# Patient Record
Sex: Male | Born: 1980 | Race: White | Hispanic: No | Marital: Single | Smoking: Never smoker
Health system: Southern US, Community
[De-identification: ages and names within clinical notes are randomized; demographics above are authoritative.]

## PROBLEM LIST (undated history)

## (undated) DIAGNOSIS — I10 Essential (primary) hypertension: Secondary | ICD-10-CM

---

## 2006-11-14 ENCOUNTER — Emergency Department (HOSPITAL_COMMUNITY): Admission: EM | Admit: 2006-11-14 | Discharge: 2006-11-14 | Payer: Self-pay | Admitting: Interventional Radiology

## 2008-09-03 IMAGING — CT CT ABDOMEN W/ CM
2 of 5 series · 17 of 46 positions shown, 19 images · IV contrast (APPLIED)
Comparison: None.

CLINICAL DATA: Abdominal pain with vomiting.  Possible dehydration.  
 ABDOMEN CT WITH CONTRAST:
TECHNIQUE: Multidetector CT imaging of the abdomen was performed following the standard protocol during bolus administration of intravenous contrast.
 Contrast:  100 cc Omnipaque 300.  Oral contrast was given.
TECHNIQUE: Multidetector CT imaging of the pelvis was performed following the standard protocol during bolus administration of intravenous contrast.

[Series 2: abd/pelv with 5.0 b31f st · axial · 0.68mm/px · z∈[-491,-66]mm · 14 of 96 slices shown, 16 images]
[im 6/96  soft-tissue]
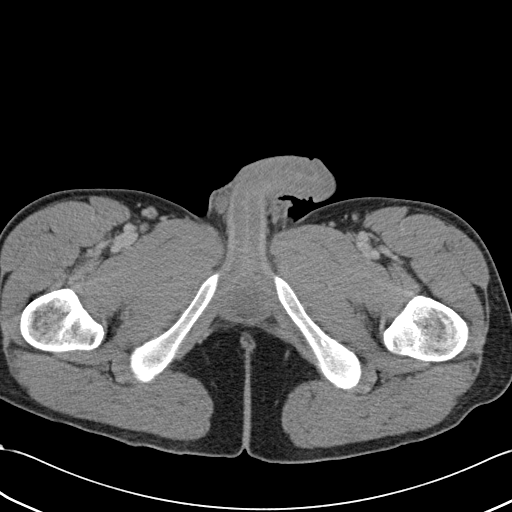
[im 6/96  bone]
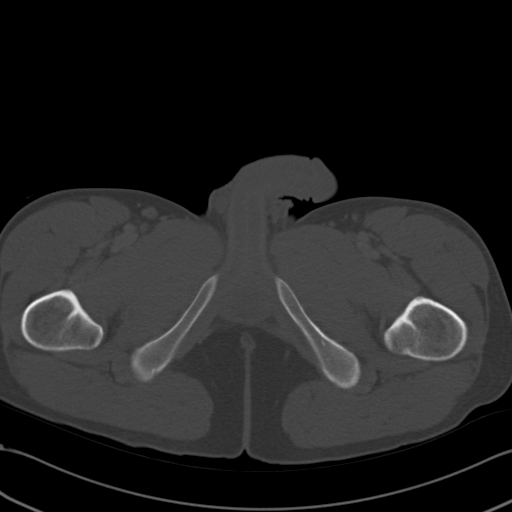
[im 11/96  soft-tissue]
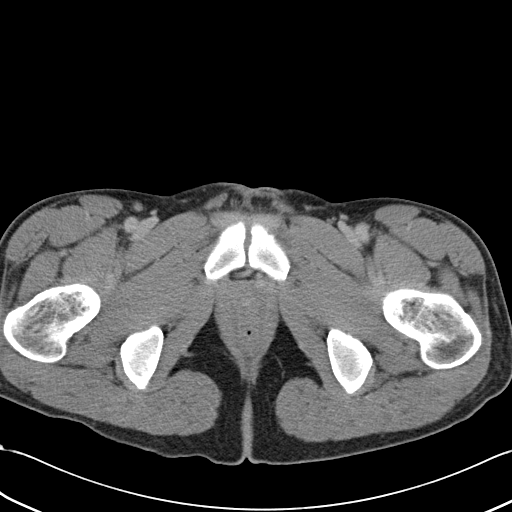
[im 21/96  soft-tissue]
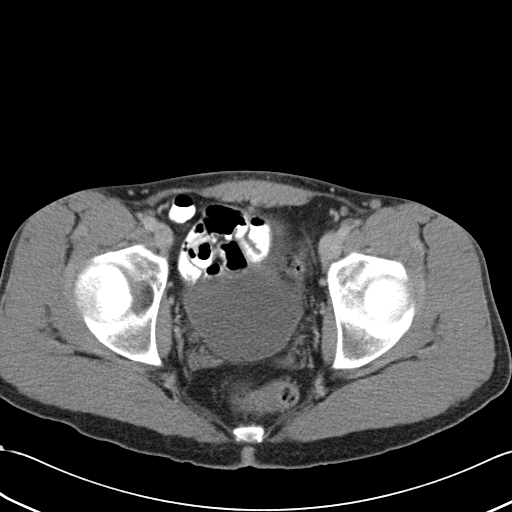
[im 26/96  soft-tissue]
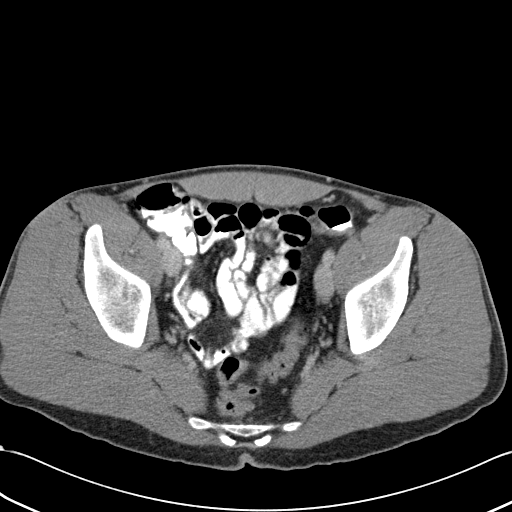
[im 31/96  soft-tissue]
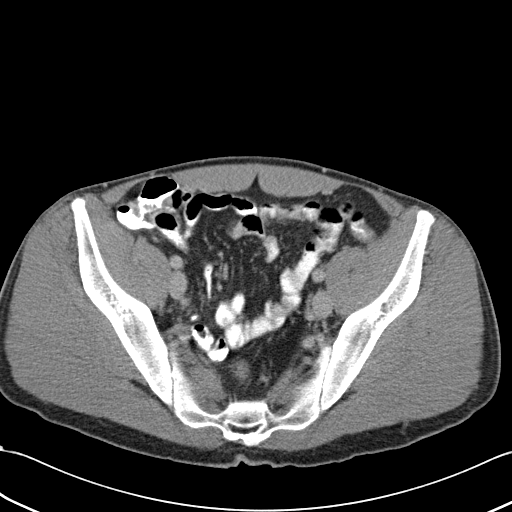
[im 41/96  soft-tissue]
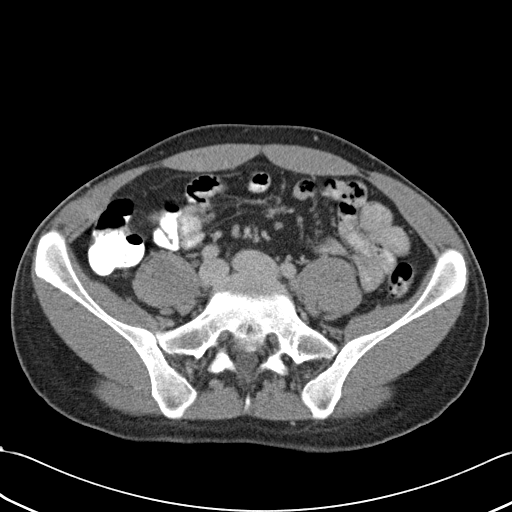
[im 46/96  soft-tissue]
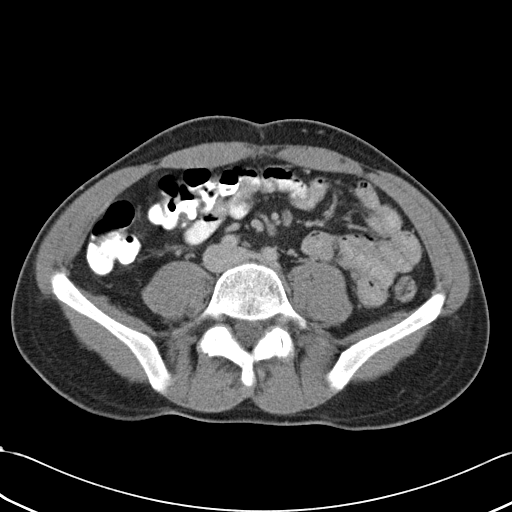
[im 51/96  soft-tissue]
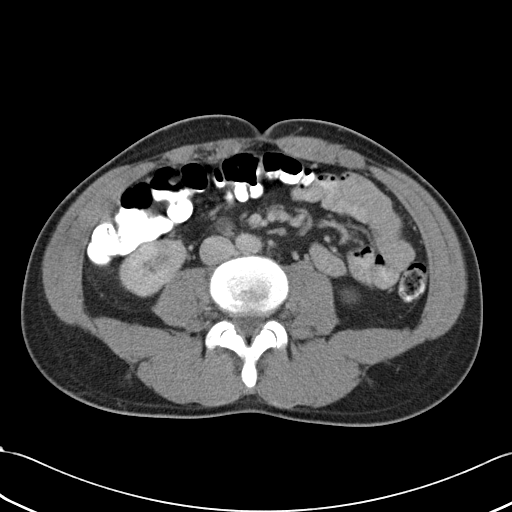
[im 56/96  soft-tissue]
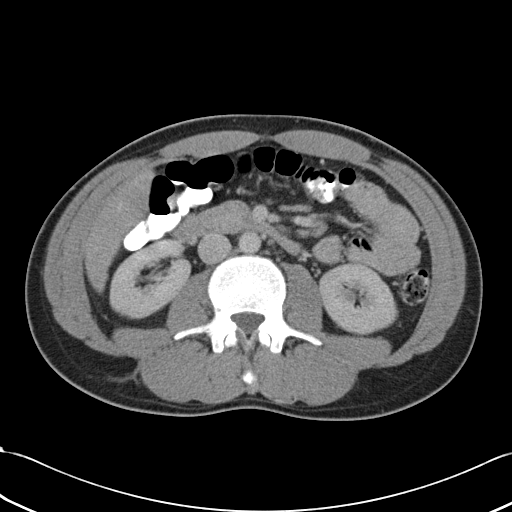
[im 56/96  bone]
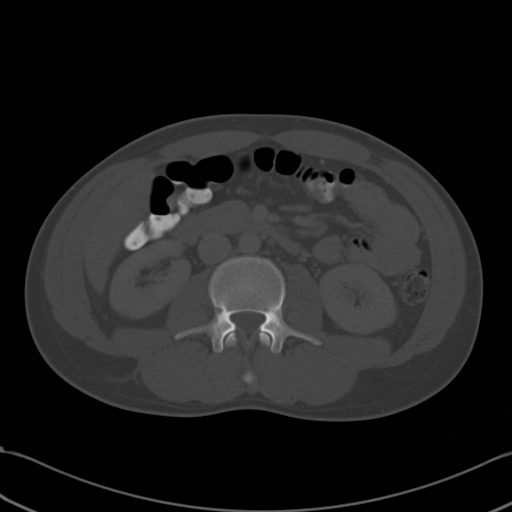
[im 66/96  soft-tissue]
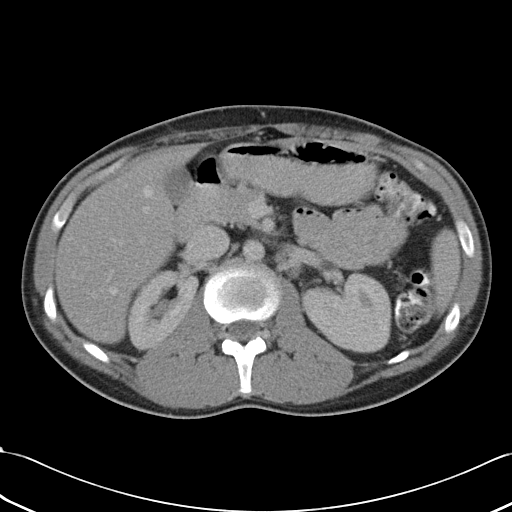
[im 71/96  soft-tissue]
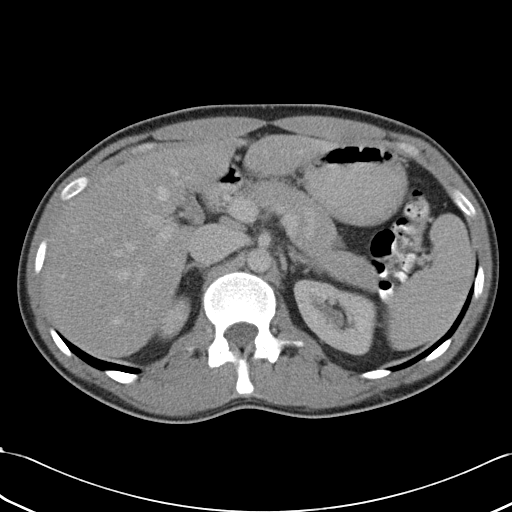
[im 76/96  soft-tissue]
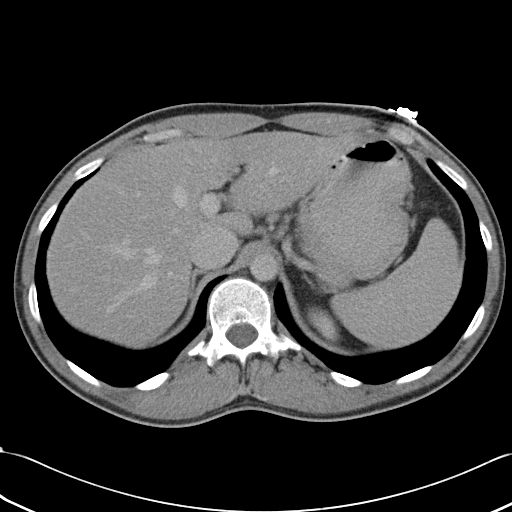
[im 86/96  soft-tissue]
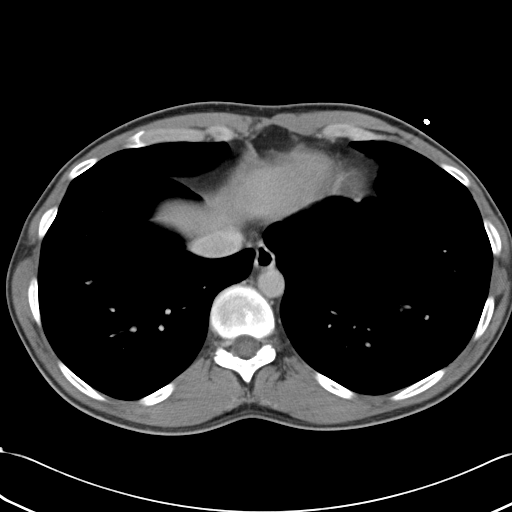
[im 91/96  soft-tissue]
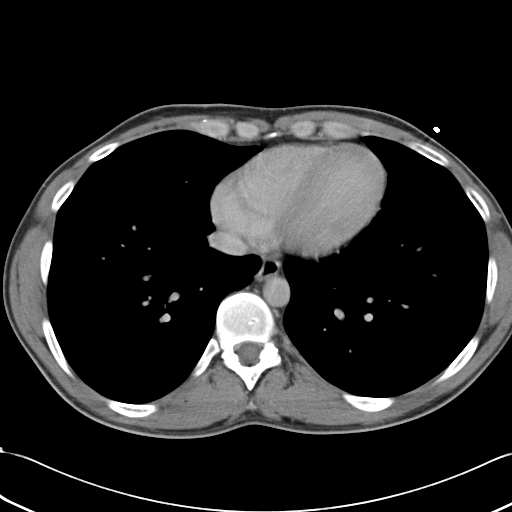

[Series 602: cor · coronal · 0.93mm/px · 3 of 106 slices shown]
[im 36/106  soft-tissue]
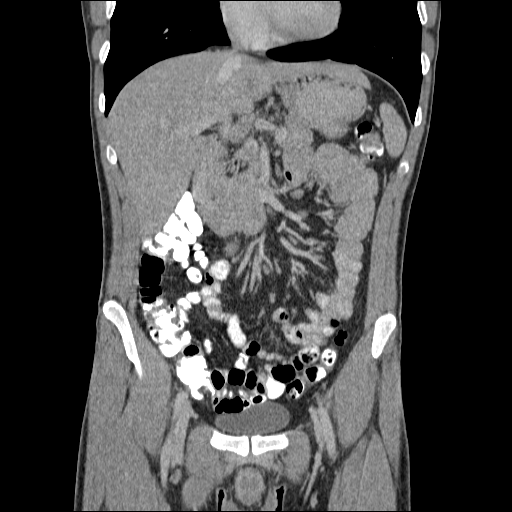
[im 47/106  soft-tissue]
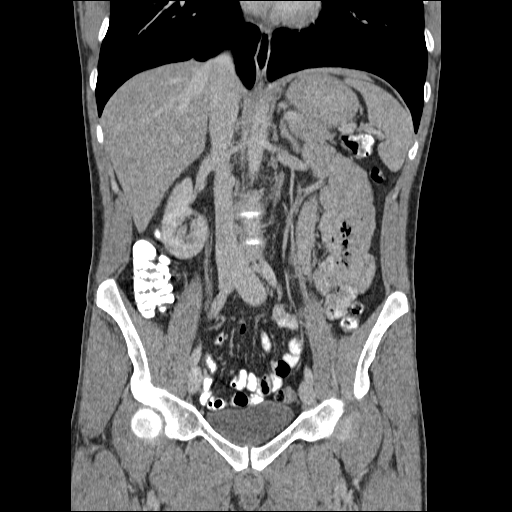
[im 59/106  soft-tissue]
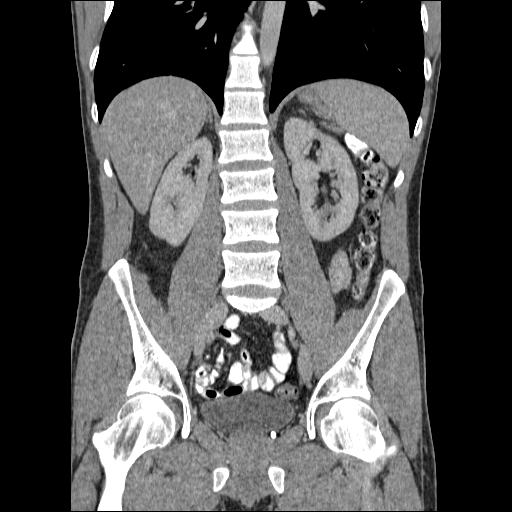

[17 of 46 positions shown; findings below may reference images not displayed]

FINDINGS: There is a 5 mm angulated subpleural nodule in the right lower lobe on image 17 which may reflect a small intrapulmonary lymph node.  No suspicious pulmonary findings are present.  There is no pleural effusion.  The liver, spleen, gallbladder, and pancreas appear normal.  The left renal collecting system is partially duplicated.  Otherwise, the kidneys and adrenal glands appear normal.  Normal sized lymph nodes are present in the base of the mesentery.  There is no evidence of bowel obstruction or intraabdominal inflammatory process.
IMPRESSION: No acute abdominal findings.  Small subpleural nodule at the right lung base is unlikely to be clinically significant.  
 PELVIS CT WITH CONTRAST:
FINDINGS: There is no pelvic mass, fluid collection, or inflammatory process.  A normal caliber appendix is filled with contrast and without surrounding inflammatory change.   It is seen on images 64 through 71.  There are pelvic phleboliths on the left.
IMPRESSION: No acute or significant findings.

## 2011-02-01 LAB — COMPREHENSIVE METABOLIC PANEL
AST: 44 — ABNORMAL HIGH
Albumin: 5
BUN: 14
Creatinine, Ser: 1.14
GFR calc Af Amer: >60
Potassium: 3.2 — ABNORMAL LOW
Total Protein: 8.1

## 2011-02-01 LAB — CBC
HCT: 45.6
MCV: 87.9
Platelets: 318
RDW: 12.7
WBC: 28.1 — ABNORMAL HIGH

## 2011-02-01 LAB — DIFFERENTIAL
Basophils Relative: 1
Lymphocytes Relative: 5 — ABNORMAL LOW
Lymphs Abs: 1.4
Monocytes Relative: 4
Neutro Abs: 25.3 — ABNORMAL HIGH

## 2011-02-01 LAB — URINALYSIS, ROUTINE W REFLEX MICROSCOPIC
Glucose, UA: NEGATIVE
Hgb urine dipstick: NEGATIVE
Specific Gravity, Urine: 1.019
pH: 5

## 2011-02-01 LAB — ETHANOL: Alcohol, Ethyl (B): 41 — ABNORMAL HIGH

## 2018-01-09 DIAGNOSIS — T63441A Toxic effect of venom of bees, accidental (unintentional), initial encounter: Secondary | ICD-10-CM | POA: Diagnosis not present

## 2018-01-09 DIAGNOSIS — L03012 Cellulitis of left finger: Secondary | ICD-10-CM | POA: Diagnosis not present

## 2018-01-31 DIAGNOSIS — H5213 Myopia, bilateral: Secondary | ICD-10-CM | POA: Diagnosis not present

## 2019-11-20 DIAGNOSIS — Z23 Encounter for immunization: Secondary | ICD-10-CM | POA: Diagnosis not present

## 2022-07-31 ENCOUNTER — Encounter (HOSPITAL_BASED_OUTPATIENT_CLINIC_OR_DEPARTMENT_OTHER): Payer: Self-pay | Admitting: Emergency Medicine

## 2022-07-31 ENCOUNTER — Emergency Department (HOSPITAL_BASED_OUTPATIENT_CLINIC_OR_DEPARTMENT_OTHER): Payer: BC Managed Care – PPO

## 2022-07-31 ENCOUNTER — Emergency Department (HOSPITAL_BASED_OUTPATIENT_CLINIC_OR_DEPARTMENT_OTHER)
Admission: EM | Admit: 2022-07-31 | Discharge: 2022-07-31 | Disposition: A | Discharge status: Home / Self Care | Payer: BC Managed Care – PPO | Attending: Emergency Medicine | Admitting: Emergency Medicine

## 2022-07-31 ENCOUNTER — Other Ambulatory Visit: Payer: Self-pay

## 2022-07-31 DIAGNOSIS — K1379 Other lesions of oral mucosa: Secondary | ICD-10-CM | POA: Diagnosis not present

## 2022-07-31 DIAGNOSIS — R09A2 Foreign body sensation, throat: Secondary | ICD-10-CM | POA: Diagnosis present

## 2022-07-31 DIAGNOSIS — I10 Essential (primary) hypertension: Secondary | ICD-10-CM | POA: Diagnosis not present

## 2022-07-31 DIAGNOSIS — R221 Localized swelling, mass and lump, neck: Secondary | ICD-10-CM

## 2022-07-31 HISTORY — DX: Essential (primary) hypertension: I10

## 2022-07-31 LAB — CBC WITH DIFFERENTIAL/PLATELET
Abs Immature Granulocytes: 0.19 10*3/uL — ABNORMAL HIGH (ref 0.00–0.07)
Basophils Absolute: 0.2 10*3/uL — ABNORMAL HIGH (ref 0.0–0.1)
Basophils Relative: 2 %
Eosinophils Absolute: 0 10*3/uL (ref 0.0–0.5)
Eosinophils Relative: 0 %
HCT: 43.8 % (ref 39.0–52.0)
Hemoglobin: 15.5 g/dL (ref 13.0–17.0)
Immature Granulocytes: 2 %
Lymphocytes Relative: 9 %
Lymphs Abs: 1.2 10*3/uL (ref 0.7–4.0)
MCH: 30.9 pg (ref 26.0–34.0)
MCHC: 35.4 g/dL (ref 30.0–36.0)
MCV: 87.4 fL (ref 80.0–100.0)
Monocytes Absolute: 0.6 10*3/uL (ref 0.1–1.0)
Monocytes Relative: 5 %
Neutro Abs: 10.3 10*3/uL — ABNORMAL HIGH (ref 1.7–7.7)
Neutrophils Relative %: 82 %
Platelets: 345 10*3/uL (ref 150–400)
RBC: 5.01 MIL/uL (ref 4.22–5.81)
RDW: 12 % (ref 11.5–15.5)
WBC: 12.5 10*3/uL — ABNORMAL HIGH (ref 4.0–10.5)
nRBC: 0 % (ref 0.0–0.2)

## 2022-07-31 LAB — COMPREHENSIVE METABOLIC PANEL
ALT: 22 U/L (ref 0–44)
AST: 25 U/L (ref 15–41)
Albumin: 4.7 g/dL (ref 3.5–5.0)
Alkaline Phosphatase: 80 U/L (ref 38–126)
Anion gap: 11 (ref 5–15)
BUN: 13 mg/dL (ref 6–20)
CO2: 23 mmol/L (ref 22–32)
Calcium: 9.3 mg/dL (ref 8.9–10.3)
Chloride: 102 mmol/L (ref 98–111)
Creatinine, Ser: 0.87 mg/dL (ref 0.61–1.24)
GFR, Estimated: >60 mL/min (ref >60)
Glucose, Bld: 101 mg/dL — ABNORMAL HIGH (ref 70–99)
Potassium: 3.9 mmol/L (ref 3.5–5.1)
Sodium: 136 mmol/L (ref 135–145)
Total Bilirubin: 0.6 mg/dL (ref 0.3–1.2)
Total Protein: 8.5 g/dL — ABNORMAL HIGH (ref 6.5–8.1)

## 2022-07-31 MED ORDER — METHYLPREDNISOLONE SODIUM SUCC 125 MG IJ SOLR
125.0000 mg | Freq: Once | INTRAMUSCULAR | Status: AC
  Administered 2022-07-31: 125 mg via INTRAVENOUS
  Filled 2022-07-31: qty 2

## 2022-07-31 MED ORDER — DIPHENHYDRAMINE HCL 50 MG/ML IJ SOLN
25.0000 mg | Freq: Once | INTRAMUSCULAR | Status: AC
  Administered 2022-07-31: 25 mg via INTRAVENOUS
  Filled 2022-07-31: qty 1

## 2022-07-31 MED ORDER — PREDNISONE 10 MG (21) PO TBPK: ORAL_TABLET | Freq: Every day | ORAL | 0 refills | Status: AC

## 2022-07-31 MED ORDER — EPINEPHRINE 0.3 MG/0.3ML IJ SOAJ
0.3000 mg | Freq: Once | INTRAMUSCULAR | Status: AC
  Administered 2022-07-31: 0.3 mg via INTRAMUSCULAR
  Filled 2022-07-31: qty 0.3

## 2022-07-31 MED ORDER — LACTATED RINGERS IV BOLUS
1000.0000 mL | Freq: Once | INTRAVENOUS | Status: AC
  Administered 2022-07-31: 1000 mL via INTRAVENOUS

## 2022-07-31 NOTE — ED Provider Notes (Signed)
EMERGENCY DEPARTMENT AT [facility] Provider Note   CSN: [id number] Arrival date & time: 07/31/22  1107     History  Chief Complaint  Patient presents with   Foreign Body in Nose    Taylor Baird is a 42 y.o. male.  Patient presents to the emergency department with feeling like something is stuck between his nasal passage of his throat.  He states he woke up this morning feeling this way.  He does state he has a history of chewing nicotine patches and wonders of 1 may have been in his mouth while he was sleeping.  He denies recent fevers, difficulty swallowing, difficulty breathing through the mouth or nose, recent upper respiratory illness, nausea or vomiting.  Past medical history significant for hypertension  HPI     Home Medications Prior to Admission medications   Medication Sig Start Date End Date Taking? Authorizing Provider  predniSONE (STERAPRED UNI-PAK 21 TAB) 10 MG (21) TBPK tablet Take by mouth daily. Take 6 tabs by mouth daily  for 2 days, then 5 tabs for 2 days, then 4 tabs for 2 days, then 3 tabs for 2 days, 2 tabs for 2 days, then 1 tab by mouth daily for 2 days 07/31/22  Yes Darrick Grinder, PA-C      Allergies    Patient has no known allergies.    Review of Systems   Review of Systems  Physical Exam Updated Vital Signs BP 131/80   Pulse 87   Temp 97.8 F (36.6 C) (Oral)   Resp 17   Ht 6\' 2"  (1.88 m)   Wt 90.7 kg   SpO2 100%   BMI 25.68 kg/m  Physical Exam HENT:     Head: Normocephalic and atraumatic.     Nose: No septal deviation, mucosal edema, congestion or rhinorrhea.     Right Nostril: No foreign body, epistaxis, septal hematoma or occlusion.     Left Nostril: No foreign body, epistaxis, septal hematoma or occlusion.     Mouth/Throat:     Mouth: No angioedema.     Dentition: No dental abscesses.     Comments: Uvula appears elongated, mildly swollen.  No uvula deviation, no foreign body appreciated, no tonsillar  swelling Eyes:     Conjunctiva/sclera: Conjunctivae normal.  Pulmonary:     Effort: Pulmonary effort is normal. No respiratory distress.  Musculoskeletal:        General: No signs of injury.     Cervical back: Normal range of motion.  Skin:    General: Skin is dry.  Neurological:     Mental Status: He is alert.  Psychiatric:        Speech: Speech normal.        Behavior: Behavior normal.     ED Results / Procedures / Treatments   Labs (all labs ordered are listed, but only abnormal results are displayed) Labs Reviewed  CBC WITH DIFFERENTIAL/PLATELET - Abnormal; Notable for the following components:      Result Value   WBC 12.5 (*)    Neutro Abs 10.3 (*)    Basophils Absolute 0.2 (*)    Abs Immature Granulocytes 0.19 (*)    All other components within normal limits  COMPREHENSIVE METABOLIC PANEL - Abnormal; Notable for the following components:   Glucose, Bld 101 (*)    Total Protein 8.5 (*)    All other components within normal limits    EKG EKG Interpretation  Date/Time:  Saturday July 31 2022  11:54:28 EDT Ventricular Rate:  115 PR Interval:  161 QRS Duration: 102 QT Interval:  324 QTC Calculation: 449 R Axis:   95 Text Interpretation: Sinus tachycardia Right atrial enlargement Inferior infarct, old No previous ECGs available Confirmed by Alvira Monday ([id number]) on 07/31/2022 12:22:05 PM  Radiology CT Soft Tissue Neck Wo Contrast  Result Date: 07/31/2022 CLINICAL DATA:  Foreign body sensation at the level of the nasopharynx EXAM: CT NECK WITHOUT CONTRAST TECHNIQUE: Multidetector CT imaging of the neck was performed following the standard protocol without intravenous contrast. RADIATION DOSE REDUCTION: This exam was performed according to the departmental dose-optimization program which includes automated exposure control, adjustment of the mA and/or kV according to patient size and/or use of iterative reconstruction technique. COMPARISON:  None Available. FINDINGS:  Pharynx and larynx: The uvula appears somewhat low-density in the long gated. No mass or airway stenosis Salivary glands: No inflammation, mass, or stone. Thyroid: Normal. Lymph nodes: None enlarged or abnormal density. Vascular: Negative. Limited intracranial: Negative. Visualized orbits: Negative. Mastoids and visualized paranasal sinuses: Clear. Skeleton: No acute or aggressive process. Upper chest: Persistent left SVC. IMPRESSION: There may be edematous thickening of the uvula. No mass or airway stenosis. Electronically Signed   By: Tiburcio Pea M.D.   On: 07/31/2022 12:48    Procedures Procedures    Medications Ordered in ED Medications  methylPREDNISolone sodium succinate (SOLU-MEDROL) 125 mg/2 mL injection 125 mg (125 mg Intravenous Given 07/31/22 1405)  EPINEPHrine (EPI-PEN) injection 0.3 mg (0.3 mg Intramuscular Given 07/31/22 1400)  diphenhydrAMINE (BENADRYL) injection 25 mg (25 mg Intravenous Given 07/31/22 1404)  lactated ringers bolus 1,000 mL (1,000 mLs Intravenous New Bag/Given 07/31/22 1404)    ED Course/ Medical Decision Making/ A&P                             Medical Decision Making Amount and/or Complexity of Data Reviewed Labs: ordered. Radiology: ordered.  Risk Prescription drug management.   Patient presents with a chief complaint of possible foreign body stuck in nasopharynx  Physical exam was grossly unremarkable other than elongated uvula.  Patient was able to breathe and swallow without difficulty.  I ordered and interpreted imaging including a CT soft tissue neck for evaluation of foreign body presence.  CT remarkable for appearance of swollen uvula.  Patient's case was discussed with my attending who also evaluated the patient at bedside.  Patient was administered an LR bolus, an EpiPen injection, Benadryl, and Solu-Medrol for possible allergic reaction.  Consultation was requested with the on-call otolaryngologist, Dr. Jearld Fenton.  He stated that this may be  due to a traumatic snoring injury.  He recommended outpatient follow-up as needed as long as patient was able to swallow and breathe without difficulty.  Initial plan was for 4 hours observation after epi administration at 2 PM.  At 5 PM patient rechecked nurse stating he felt better and was ready for discharge home.  Patient discharged home on prednisone with recommendation for outpatient ENT follow-up.  Return precautions provided including difficulty swallowing, difficulty breathing        Final Clinical Impression(s) / ED Diagnoses Final diagnoses:  Swollen uvula    Rx / DC Orders ED Discharge Orders          Ordered    predniSONE (STERAPRED UNI-PAK 21 TAB) 10 MG (21) TBPK tablet  Daily        07/31/22 1716  Pamala Duffel 07/31/22 1720    Alvira Monday, MD 07/31/22 2145

## 2022-07-31 NOTE — ED Triage Notes (Signed)
Patient woke up today feeling like something in stuck between his nasal passage and throat. Patient chews nicotine patches and is not sure if that is related.

## 2022-07-31 NOTE — ED Notes (Signed)
Entered Departure condition in error

## 2022-07-31 NOTE — ED Notes (Signed)
ED Provider at bedside. 

## 2022-07-31 NOTE — Discharge Instructions (Addendum)
You were diagnosed today with a swollen uvula.  I have sent a prescription for steroids.  Please follow-up with ear nose and throat for further evaluation and management.  If you develop difficulty swallowing or breathing please return to the emergency department for emergent evaluation.
